# Patient Record
Sex: Male | Born: 1945 | Race: White | Hispanic: No | Marital: Single
Health system: Southern US, Community
[De-identification: ages and names within clinical notes are randomized; demographics above are authoritative.]

---

## 2010-11-15 ENCOUNTER — Ambulatory Visit (HOSPITAL_COMMUNITY)
Admission: RE | Admit: 2010-11-15 | Discharge: 2010-11-15 | Disposition: A | Discharge status: Home / Self Care | Payer: Worker's Compensation | Source: Ambulatory Visit | Attending: Surgery | Admitting: Surgery

## 2010-11-15 ENCOUNTER — Other Ambulatory Visit: Payer: Self-pay | Admitting: Surgery

## 2010-11-15 ENCOUNTER — Ambulatory Visit (HOSPITAL_COMMUNITY): Payer: Worker's Compensation

## 2010-11-15 DIAGNOSIS — K429 Umbilical hernia without obstruction or gangrene: Secondary | ICD-10-CM | POA: Insufficient documentation

## 2010-11-15 DIAGNOSIS — Z01812 Encounter for preprocedural laboratory examination: Secondary | ICD-10-CM | POA: Insufficient documentation

## 2010-11-15 DIAGNOSIS — K4090 Unilateral inguinal hernia, without obstruction or gangrene, not specified as recurrent: Secondary | ICD-10-CM | POA: Insufficient documentation

## 2010-11-15 DIAGNOSIS — I10 Essential (primary) hypertension: Secondary | ICD-10-CM | POA: Insufficient documentation

## 2010-11-15 DIAGNOSIS — Z01818 Encounter for other preprocedural examination: Secondary | ICD-10-CM | POA: Insufficient documentation

## 2010-11-15 DIAGNOSIS — L821 Other seborrheic keratosis: Secondary | ICD-10-CM | POA: Insufficient documentation

## 2010-11-15 LAB — CBC
HCT: 45.5 % (ref 39.0–52.0)
MCH: 33.6 pg (ref 26.0–34.0)
MCHC: 35.6 g/dL (ref 30.0–36.0)
MCV: 94.4 fL (ref 78.0–100.0)
Platelets: 274 10*3/uL (ref 150–400)
RDW: 12.8 % (ref 11.5–15.5)
WBC: 11.4 10*3/uL — ABNORMAL HIGH (ref 4.0–10.5)

## 2010-11-15 LAB — BASIC METABOLIC PANEL
BUN: 10 mg/dL (ref 6–23)
Calcium: 9.2 mg/dL (ref 8.4–10.5)
Creatinine, Ser: 1.15 mg/dL (ref 0.4–1.5)
GFR calc non Af Amer: >60 mL/min (ref >60)
Glucose, Bld: 122 mg/dL — ABNORMAL HIGH (ref 70–99)
Potassium: 4.5 mEq/L (ref 3.5–5.1)

## 2010-11-15 LAB — SURGICAL PCR SCREEN: Staphylococcus aureus: NEGATIVE

## 2010-11-16 NOTE — Op Note (Signed)
Nathan Savage, Nathan Savage                 ACCOUNT NO.:  [id number]  MEDICAL RECORD NO.:  [id number]           PATIENT TYPE:  O  LOCATION:  [facility]                         FACILITY:  [facility]  PHYSICIAN:  Wilmon Arms. Corliss Skains, M.D. DATE OF BIRTH:  [birth year]  DATE OF PROCEDURE:  11/15/2010 DATE OF DISCHARGE:  11/15/2010                              OPERATIVE REPORT   PREOPERATIVE. DIAGNOSES: 1. Right inguinal hernia. 2. Umbilical hernia.  POSTOPERATIVE DIAGNOSES: 1. Right inguinal hernia. 2. Umbilical hernia. 3. Suspicious right groin skin lesion.  PROCEDURE PERFORMED: 1. Right inguinal hernia repair with mesh. 2. Placement of On-Q catheter. 3. Biopsy of right inguinal skin lesion. 4. Umbilical hernia repair.  SURGEON:  Wilmon Arms. Corliss Skains, MD  ANESTHESIA:  General  INDICATIONS:  This is a 65 year old male who was lifting a heavy weight at work in January of this year.  He immediately began having some right groin pain.  This has developed into a large bulge in his groin.  He has also had some periumbilical pain.  I examined last week and noted a very large partially reducible right inguinal hernia.  He also has a small reducible umbilical hernia.  He presents now for repair of both of these hernias.  DESCRIPTION OF PROCEDURE:  The patient was brought to the operating room, placed in the supine position on operating room table.  After an adequate level of general anesthesia was obtained, the patient's abdomen and right groin were shaved, prepped with ChloraPrep and draped in sterile fashion.  Time-out was taken to assure the proper patient and proper procedure.  When we were shaving the patient, we noted a suspicious pigmented lesion in his right groin.  Time-out was taken to assure proper patient, proper procedures.  We infiltrated the area around the skin lesion with 0.25% Marcaine with epinephrine.  I excised this with an elliptical incision.  Hemostasis was obtained with cautery. We  closed this with a 4-0 Monocryl.  Using a fresh 15 blade, we made an oblique incision above the right inguinal ligament.  Dissection was carried down to the fascia with cautery.  The external oblique fascia was opened along the direction of its fibers down to the external ring. A very large direct hernia was identified.  We circumferentially dissect around the spermatic cord and retracted this with a Penrose drain.  We dissected the direct hernia sac free from the surrounding tissue to reduce this.  The floor of the inguinal canal was imbricated with a 0 Vicryl.  We then skeletonized the spermatic cord.  There is no sign of indirect hernia.  We cut a Ultrapro mesh in a keyhole shape and secured this with 2-0 Prolene beginning at the pubic tubercle.  We ran this along the internal oblique fascia superiorly and the inguinal ligament inferiorly.  The tails of the mesh was sutured together behind the cord. The mesh was placed underneath the external oblique fascia.  We then inserted the On-Q catheter through a small stab incision laterally.  We threaded this through the external oblique fascia and laid the catheter along the mesh.  The external oblique fascia  was reapproximated with 2-0 Vicryl.  3-0 Vicryl was used to close the subcutaneous tissues.  The catheter was bolused with 5 mL of corpus and Marcaine.  4-0 Monocryl was used to close the skin incision.  We then covered this area with a clean towel.  We infiltrated the area around the umbilicus with 0.25% Marcaine with epinephrine.  A transverse incision was made.  Dissection was carried down to the subcutaneous tissues with cautery.  We dissected around a very small umbilical hernia.  We reduced this back to the hernia defect.  The hernia defect measured about 1 cm in diameter.  We closed with three figure-of-eight 0 Novafil sutures.  The base of the umbilical skin was tacked down with 3- 0 Vicryl.  3-0 Vicryl was used to close the  subcutaneous tissues, and 4- 0 Monocryl was used to close the skin.  Steri-Strips and clean dressing were applied were applied to the umbilical incision.  Steri-Strips and a clean dressing were applied to the groin incisions.  The On-Q catheter was connected to a pump containing 115 mL of 0.25% Marcaine.  The catheter was dressed with Tegaderm dressing.  The patient was then extubated, brought to recovery room in stable condition.  All sponge, instrument, and needle counts were correct.     Wilmon Arms. Corliss Skains, M.D.     MKT/MEDQ  D:  11/15/2010  T:  11/15/2010  Job:  [id number]  Electronically Signed by Manus Rudd M.D. on 11/16/2010 01:05:19 PM

## 2012-10-26 IMAGING — CR DG CHEST 2V
2 series · 2 of 2 positions shown · non-contrast
Comparison: None.

CLINICAL DATA: Preoperative respiratory evaluation.

CHEST - 2 VIEW

[view not recorded (1 of 2)]
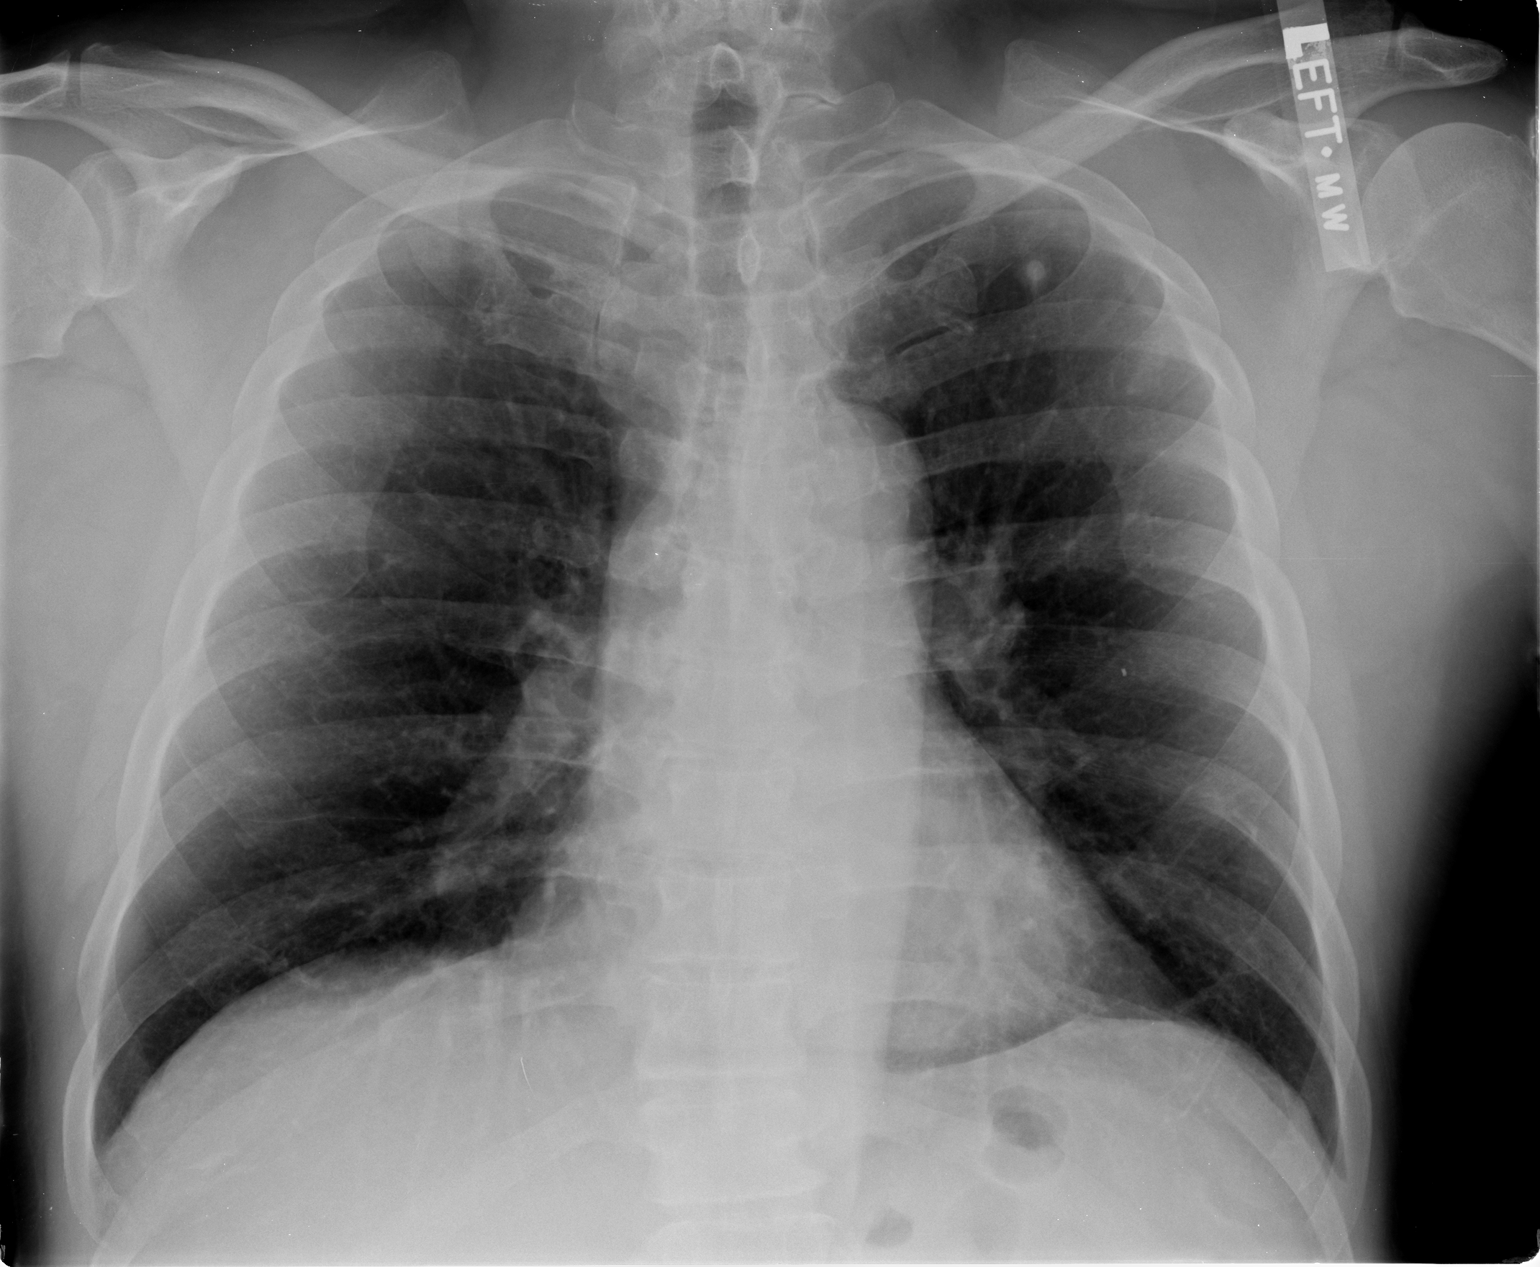

[view not recorded (2 of 2)]
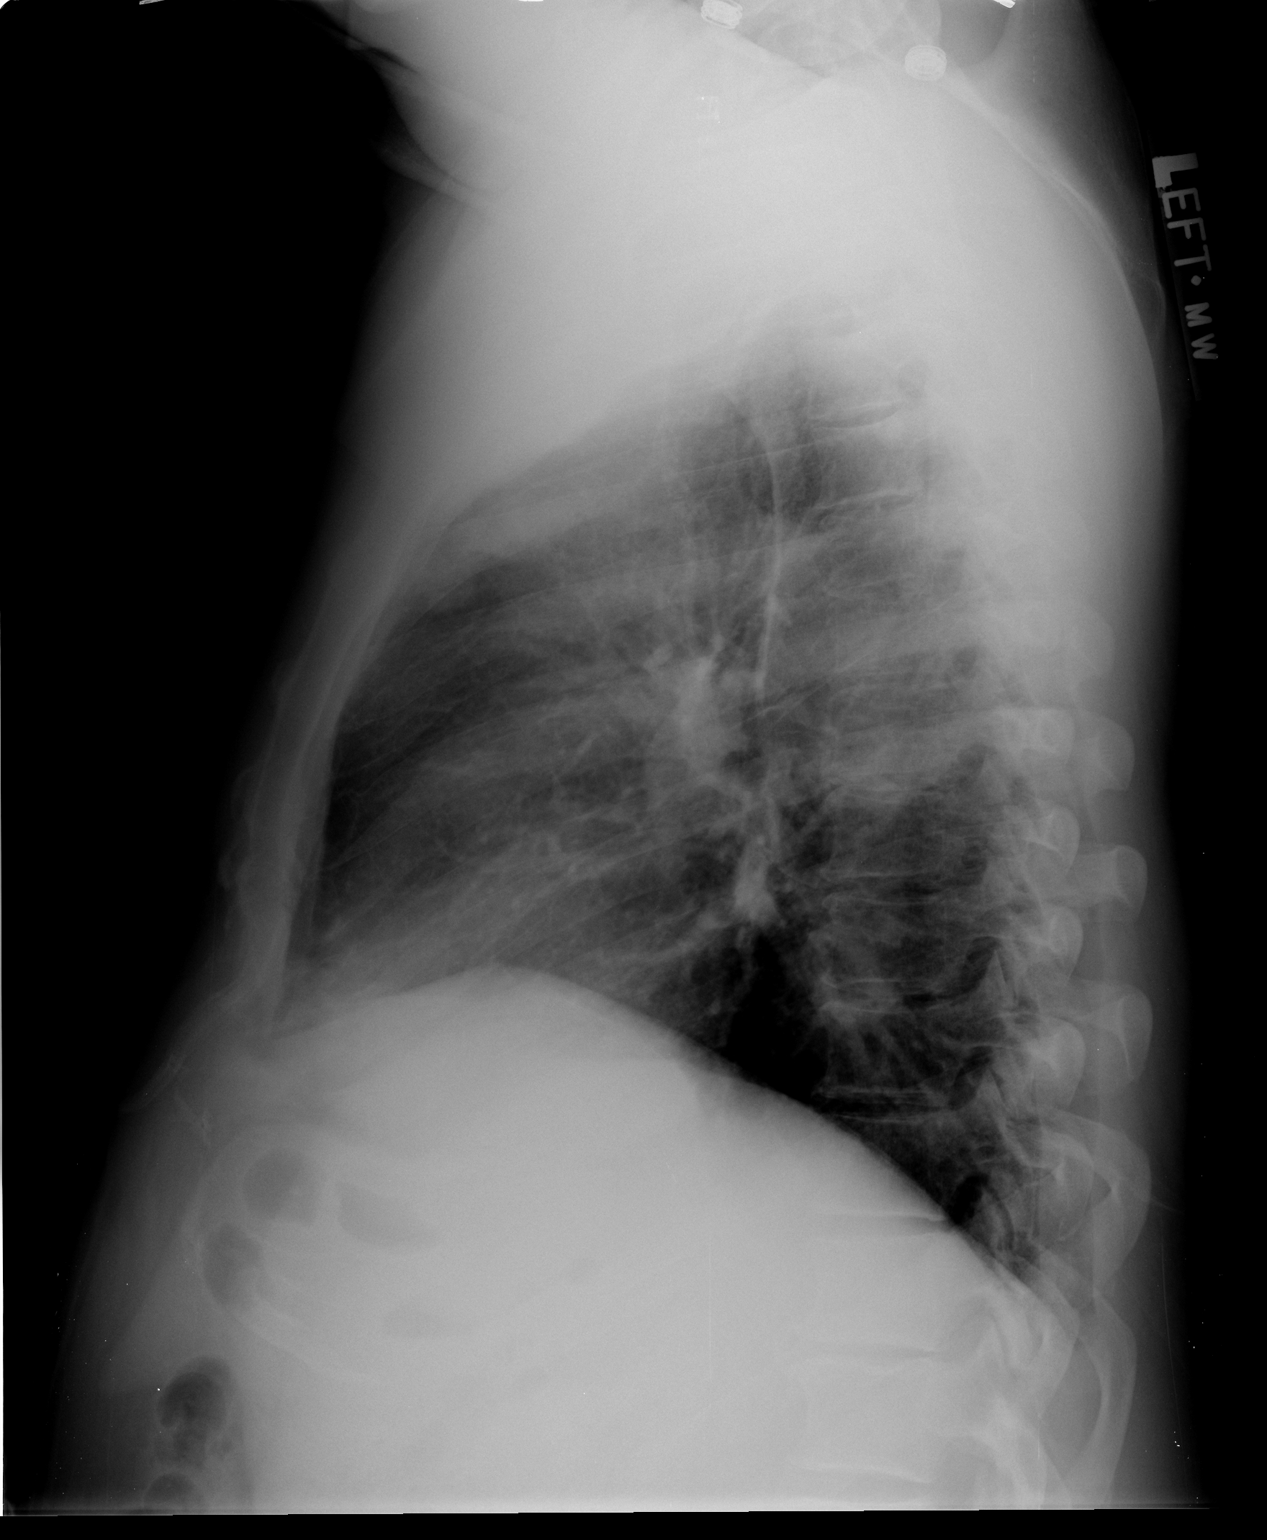

[2 of 2 positions shown; findings below may reference images not displayed]

FINDINGS: The lungs are clear without focal infiltrate, edema,
pneumothorax or pleural effusion.  Tiny very dense nodule in the
left apex is most likely a granuloma given the high density
relative to its tiny size.  A second 1 x 2 cm flame shaped opacity
is superimposed on the left anterior third rib.  On the lateral
film, there appears to be a nodular density overlying the anterior
upper lungs. The cardiopericardial silhouette is within normal
limits for size. Imaged bony structures of the thorax are intact.
IMPRESSION: Two nodular densities in the left upper lobe, one of which appears
to be a granuloma.  The second does not show evidence of
mineralization.  As neoplasm could have this appearance, CT imaging
of the chest without contrast is recommended to further evaluate.

## 2017-08-12 ENCOUNTER — Other Ambulatory Visit: Payer: Self-pay | Admitting: Physician Assistant

## 2017-08-12 ENCOUNTER — Ambulatory Visit (HOSPITAL_COMMUNITY)
Admission: RE | Admit: 2017-08-12 | Discharge: 2017-08-12 | Disposition: A | Discharge status: Home / Self Care | Payer: 59 | Source: Ambulatory Visit | Attending: Physician Assistant | Admitting: Physician Assistant

## 2017-08-12 DIAGNOSIS — M79605 Pain in left leg: Secondary | ICD-10-CM

## 2017-08-12 NOTE — Progress Notes (Signed)
LLE venous duplex prelim: negative for DVT and baker's cyst. Farrel DemarkJill Eunice, RDMS, RVT Called results to [place].
